# Patient Record
Sex: Male | Born: 1990 | Race: White | Hispanic: No | Marital: Married | Smoking: Never smoker
Health system: Southern US, Community
[De-identification: ages and names within clinical notes are randomized; demographics above are authoritative.]

---

## 2017-03-14 DIAGNOSIS — R300 Dysuria: Secondary | ICD-10-CM | POA: Insufficient documentation

## 2020-11-24 ENCOUNTER — Other Ambulatory Visit: Payer: Self-pay

## 2020-11-24 ENCOUNTER — Ambulatory Visit (INDEPENDENT_AMBULATORY_CARE_PROVIDER_SITE_OTHER): Payer: BC Managed Care – PPO | Admitting: Urology

## 2020-11-24 ENCOUNTER — Encounter: Payer: Self-pay | Admitting: Urology

## 2020-11-24 VITALS — BP 128/87 | HR 84 | Ht 68.0 in | Wt 158.0 lb

## 2020-11-24 DIAGNOSIS — F524 Premature ejaculation: Secondary | ICD-10-CM

## 2020-11-24 NOTE — Progress Notes (Signed)
   11/24/2020 11:40 AM   Matthew Meadows 1991-04-15 [id number]  Referring provider: Wilford Corner, PA-C [address] [place],  [place] [address]  Chief Complaint  Patient presents with  . Erectile Dysfunction    HPI: Matthew Meadows is a 30 y.o. male referred for erectile dysfunction.   Seen by PCP 11/08/2020 with a 2-year history of intermittent erectile dysfunction which has been more bothersome the last several months  Able to achieve an erection firm enough for penetration but complains of difficulty maintaining the erection  Estimates he will lose the erection 5 minutes after vaginal penetration and loses the erection after ejaculation  States he does feel his erections are less firm than previous  No organic risk factors including diabetes, neurologic disease, hypertension  Testosterone level normal at 603   PMH: History reviewed. No pertinent past medical history.  Surgical History: History reviewed. No pertinent surgical history.  Home Medications:  Allergies as of 11/24/2020   No Known Allergies     Medication List    as of November 24, 2020 11:40 AM   You have not been prescribed any medications.     Allergies: No Known Allergies  Family History: History reviewed. No pertinent family history.  Social History:  reports that he has never smoked. He has never used smokeless tobacco. He reports current alcohol use. No history on file for drug use.   Physical Exam: BP 128/87   Pulse 84   Ht 5\' 8"  (1.727 m)   Wt 158 lb (71.7 kg)   BMI 24.02 kg/m   Constitutional:  Alert and oriented, No acute distress. HEENT: [place] AT, moist mucus membranes.  Trachea midline, no masses. Cardiovascular: No clubbing, cyanosis, or edema. Respiratory: Normal respiratory effort, no increased work of breathing. GU: Phallus circumcised without lesions, testes descended bilaterally without masses or tenderness Skin: No rashes, bruises or suspicious  lesions. Neurologic: Grossly intact, no focal deficits, moving all 4 extremities. Psychiatric: Normal mood and affect.   Assessment & Plan:    1.  Premature ejaculation  We discussed that loss of erection after ejaculation is physiologic and this is not considered erectile dysfunction  He did say his initial erections are not as firm enough as previous however has no difficulty with penetration.  No organic risk factors for ED  We discussed various treatments for premature ejaculation including methods to decrease sensitivity (condom, topical anesthetics) and SSRI medications for their side effects of delayed ejaculation  I did offer him a trial of a PDE 5 inhibitor since he feels his erections are not as firm.  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

## 2020-12-22 ENCOUNTER — Other Ambulatory Visit: Payer: Self-pay | Admitting: Family Medicine

## 2020-12-22 ENCOUNTER — Other Ambulatory Visit (HOSPITAL_COMMUNITY): Payer: Self-pay | Admitting: Family Medicine

## 2020-12-22 DIAGNOSIS — N5089 Other specified disorders of the male genital organs: Secondary | ICD-10-CM

## 2020-12-28 ENCOUNTER — Other Ambulatory Visit: Payer: Self-pay

## 2020-12-28 ENCOUNTER — Ambulatory Visit (HOSPITAL_COMMUNITY)
Admission: RE | Admit: 2020-12-28 | Discharge: 2020-12-28 | Disposition: A | Discharge status: Home / Self Care | Payer: BC Managed Care – PPO | Source: Ambulatory Visit | Attending: Family Medicine | Admitting: Family Medicine

## 2020-12-28 ENCOUNTER — Other Ambulatory Visit (HOSPITAL_COMMUNITY): Payer: Self-pay | Admitting: Family Medicine

## 2020-12-28 DIAGNOSIS — N5089 Other specified disorders of the male genital organs: Secondary | ICD-10-CM | POA: Diagnosis not present

## 2021-02-04 ENCOUNTER — Ambulatory Visit (INDEPENDENT_AMBULATORY_CARE_PROVIDER_SITE_OTHER): Payer: BC Managed Care – PPO | Admitting: Urology

## 2021-02-04 ENCOUNTER — Other Ambulatory Visit: Payer: Self-pay

## 2021-02-04 ENCOUNTER — Encounter: Payer: Self-pay | Admitting: Urology

## 2021-02-04 VITALS — BP 125/80 | HR 0 | Ht 68.0 in | Wt 158.0 lb

## 2021-02-04 DIAGNOSIS — N5082 Scrotal pain: Secondary | ICD-10-CM

## 2021-02-04 DIAGNOSIS — N4340 Spermatocele of epididymis, unspecified: Secondary | ICD-10-CM | POA: Diagnosis not present

## 2021-02-04 MED ORDER — ETODOLAC 400 MG PO TABS: ORAL_TABLET | ORAL | 0 refills | Status: DC

## 2021-02-04 NOTE — Progress Notes (Signed)
   02/04/2021 10:11 AM   Matthew Meadows [birth year] [id number]  Referring provider: Wilford Corner, PA-C [address] [place],  [place] [address]  Chief Complaint  Patient presents with   Other    HPI: 30 y.o. male seen 11/24/2020 for erectile dysfunction presents for evaluation of an epididymal cyst.  Seen [facility] 12/22/2020 complaining of a right hemiscrotal mass with mild discomfort Scrotal sonogram performed 12/28/2020 with normal-appearing testes and a 4 mm cyst of the right globus major Complains of right testis pain which is worse with intercourse Tenderness is localized to this palpable area No bothersome LUTS   PMH: History reviewed. No pertinent past medical history.  Surgical History: History reviewed. No pertinent surgical history.  Home Medications:  Allergies as of 02/04/2021   No Known Allergies      Medication List    as of February 04, 2021 10:11 AM   You have not been prescribed any medications.     Allergies: No Known Allergies  Family History: History reviewed. No pertinent family history.  Social History:  reports that he has never smoked. He has never used smokeless tobacco. He reports current alcohol use. No history on file for drug use.   Physical Exam: BP 125/80   Pulse (!) 0   Ht 5\' 8"  (1.727 m)   Wt 158 lb (71.7 kg)   BMI 24.02 kg/m   Constitutional:  Alert and oriented, No acute distress. HEENT: [place] AT, moist mucus membranes.  Trachea midline, no masses. Cardiovascular: No clubbing, cyanosis, or edema. Respiratory: Normal respiratory effort, no increased work of breathing. GI: Abdomen is soft, nontender, nondistended, no abdominal masses GU: <5 mm smooth, spherical mass superior to right testis, minimal tenderness Skin: No rashes, bruises or suspicious lesions. Neurologic: Grossly intact, no focal deficits, moving all 4 extremities. Psychiatric: Normal mood and affect.   Pertinent Imaging: Ultrasound images  personally reviewed and interpreted   Assessment & Plan:    1.  Right hemiscrotal pain Small spermatocele right testis Would recommend conservative management initially with scrotal support and NSAIDs Rx etodolac sent to pharmacy We discussed typically it is uncommon for a spermatocele to be an etiology of scrotal pain unless it is of significant size We discussed the cyst originate from the reservoir which store sperm and that surgical excision could resulting scarring of the epididymal tubules We also discussed surgery for scrotal pain is typically felt to be ~ 50% effective If no improvement with NSAID consider a trial of low-dose amitriptyline 1 month follow-up symptom reassessment  2.  Right spermatocele As above   , MD  [facility] [address] [place], [place] [phone]

## 2021-02-17 ENCOUNTER — Encounter: Payer: Self-pay | Admitting: Emergency Medicine

## 2021-02-17 DIAGNOSIS — R1012 Left upper quadrant pain: Secondary | ICD-10-CM | POA: Diagnosis present

## 2021-02-17 DIAGNOSIS — K2900 Acute gastritis without bleeding: Secondary | ICD-10-CM | POA: Insufficient documentation

## 2021-02-17 LAB — URINALYSIS, COMPLETE (UACMP) WITH MICROSCOPIC
Bacteria, UA: NONE SEEN
Bilirubin Urine: NEGATIVE
Glucose, UA: NEGATIVE mg/dL
Hgb urine dipstick: NEGATIVE
Ketones, ur: NEGATIVE mg/dL
Leukocytes,Ua: NEGATIVE
Nitrite: NEGATIVE
Protein, ur: NEGATIVE mg/dL
Specific Gravity, Urine: 1.024 (ref 1.005–1.030)
Squamous Epithelial / HPF: NONE SEEN (ref 0–5)
pH: 6 (ref 5.0–8.0)

## 2021-02-17 LAB — CBC
HCT: 43.6 % (ref 39.0–52.0)
Hemoglobin: 15.3 g/dL (ref 13.0–17.0)
MCH: 29.4 pg (ref 26.0–34.0)
MCHC: 35.1 g/dL (ref 30.0–36.0)
MCV: 83.7 fL (ref 80.0–100.0)
Platelets: 250 10*3/uL (ref 150–400)
RBC: 5.21 MIL/uL (ref 4.22–5.81)
RDW: 11.9 % (ref 11.5–15.5)
WBC: 6.3 10*3/uL (ref 4.0–10.5)
nRBC: 0 % (ref 0.0–0.2)

## 2021-02-17 LAB — COMPREHENSIVE METABOLIC PANEL
ALT: 35 U/L (ref 0–44)
AST: 25 U/L (ref 15–41)
Albumin: 4.8 g/dL (ref 3.5–5.0)
Alkaline Phosphatase: 54 U/L (ref 38–126)
Anion gap: 6 (ref 5–15)
BUN: 19 mg/dL (ref 6–20)
CO2: 30 mmol/L (ref 22–32)
Calcium: 9.7 mg/dL (ref 8.9–10.3)
Chloride: 105 mmol/L (ref 98–111)
Creatinine, Ser: 1.24 mg/dL (ref 0.61–1.24)
GFR, Estimated: >60 mL/min (ref >60)
Glucose, Bld: 103 mg/dL — ABNORMAL HIGH (ref 70–99)
Potassium: 3.9 mmol/L (ref 3.5–5.1)
Sodium: 141 mmol/L (ref 135–145)
Total Bilirubin: 1.3 mg/dL — ABNORMAL HIGH (ref 0.3–1.2)
Total Protein: 7.9 g/dL (ref 6.5–8.1)

## 2021-02-17 LAB — TROPONIN I (HIGH SENSITIVITY): Troponin I (High Sensitivity): 6 ng/L (ref <18)

## 2021-02-17 LAB — LIPASE, BLOOD: Lipase: 36 U/L (ref 11–51)

## 2021-02-17 NOTE — ED Triage Notes (Signed)
Pt c/o LUQ abdominal pain that has been intermittent over 1 month. Pt recently diagnosed with hemiscrotal mass to the right testis with medication management. Pt denies emesis and diarrhea but has had nausea over the last month.

## 2021-02-18 ENCOUNTER — Emergency Department
Admission: EM | Admit: 2021-02-18 | Discharge: 2021-02-18 | Disposition: A | Discharge status: Home / Self Care | Payer: No Typology Code available for payment source | Attending: Emergency Medicine | Admitting: Emergency Medicine

## 2021-02-18 DIAGNOSIS — K2900 Acute gastritis without bleeding: Secondary | ICD-10-CM

## 2021-02-18 DIAGNOSIS — R1012 Left upper quadrant pain: Secondary | ICD-10-CM

## 2021-02-18 MED ORDER — LIDOCAINE VISCOUS HCL 2 % MT SOLN: 15.0000 mL | OROMUCOSAL | 0 refills | Status: DC | PRN

## 2021-02-18 NOTE — ED Provider Notes (Signed)
[facility] Emergency Department Provider Note   ____________________________________________   Event Date/Time   First MD Initiated Contact with Patient 02/18/21 0121     (approximate)  I have reviewed the triage vital signs and the nursing notes.   HISTORY  Chief Complaint Abdominal Pain    HPI Matthew Meadows is a 30 y.o. male with no significant past medical history who presents to the ED complaining of abdominal pain.  Patient reports that he has been dealing with persistent pain in the left upper quadrant of his abdomen for multiple months.  He describes it as a dull ache that seems to be worse when he has taken either ibuprofen or the ketorolac recently prescribed by urology.  Pain seems to improve when he eats and he denies any nausea, vomiting, changes in bowel movements, or blood in his stool.  This is occasionally associated with pain in his chest, which is also been going on for multiple months.  He denies any fevers, cough, or shortness of breath.  He has been previously evaluated by a cardiologist with unremarkable work-up.  He thought he could be developing a ulcer so he started taking omeprazole over the past 3 days, but denies any improvement in his pain.        History reviewed. No pertinent past medical history.  There are no problems to display for this patient.   History reviewed. No pertinent surgical history.  Prior to Admission medications   Medication Sig Start Date End Date Taking? Authorizing Provider  lidocaine (XYLOCAINE) 2 % solution Use as directed 15 mLs in the mouth or throat as needed for mouth pain. 02/18/21  Yes Chesley Noon, MD  etodolac (LODINE) 400 MG tablet 1 tab twice daily x2 weeks then 1 tab twice daily as needed for pain 02/04/21   Stoioff, Verna Czech, MD    Allergies Patient has no known allergies.  History reviewed. No pertinent family history.  Social History Social History   Tobacco Use   Smoking  status: Never   Smokeless tobacco: Never  Substance Use Topics   Alcohol use: Yes    Review of Systems  Constitutional: No fever/chills Eyes: No visual changes. ENT: No sore throat. Cardiovascular: Positive for chest pain. Respiratory: Denies shortness of breath. Gastrointestinal: Positive for abdominal pain.  No nausea, no vomiting.  No diarrhea.  No constipation. Genitourinary: Negative for dysuria. Musculoskeletal: Negative for back pain. Skin: Negative for rash. Neurological: Negative for headaches, focal weakness or numbness.  ____________________________________________   PHYSICAL EXAM:  VITAL SIGNS: ED Triage Vitals  Enc Vitals Group     BP 02/17/21 2158 133/87     Pulse Rate 02/17/21 2158 76     Resp 02/17/21 2158 17     Temp 02/17/21 2158 98.7 F (37.1 C)     Temp Source 02/17/21 2158 Oral     SpO2 02/17/21 2158 99 %     Weight 02/17/21 2155 157 lb 13.6 oz (71.6 kg)     Height 02/17/21 2155 5\' 8"  (1.727 m)     Head Circumference --      Peak Flow --      Pain Score --      Pain Loc --      Pain Edu? --      Excl. in GC? --     Constitutional: Alert and oriented. Eyes: Conjunctivae are normal. Head: Atraumatic. Nose: No congestion/rhinnorhea. Mouth/Throat: Mucous membranes are moist. Neck: Normal ROM Cardiovascular: Normal rate, regular rhythm.  Grossly normal heart sounds.  2+ radial pulses bilaterally. Respiratory: Normal respiratory effort.  No retractions. Lungs CTAB.  No chest wall tenderness to palpation. Gastrointestinal: Soft and nontender. No distention. Genitourinary: deferred Musculoskeletal: No lower extremity tenderness nor edema. Neurologic:  Normal speech and language. No gross focal neurologic deficits are appreciated. Skin:  Skin is warm, dry and intact. No rash noted. Psychiatric: Mood and affect are normal. Speech and behavior are normal.  ____________________________________________   LABS (all labs ordered are listed, but only  abnormal results are displayed)  Labs Reviewed  COMPREHENSIVE METABOLIC PANEL - Abnormal; Notable for the following components:      Result Value   Glucose, Bld 103 (*)    Total Bilirubin 1.3 (*)    All other components within normal limits  URINALYSIS, COMPLETE (UACMP) WITH MICROSCOPIC - Abnormal; Notable for the following components:   Color, Urine YELLOW (*)    APPearance CLEAR (*)    All other components within normal limits  LIPASE, BLOOD  CBC  TROPONIN I (HIGH SENSITIVITY)   ____________________________________________  EKG  ED ECG REPORT I, Chesley Noon, the attending physician, personally viewed and interpreted this ECG.   Date: 02/18/2021  EKG Time: 22:00  Rate: 70  Rhythm: normal sinus rhythm  Axis: RAD  Intervals:none  ST&T Change: None   PROCEDURES  Procedure(s) performed (including Critical Care):  Procedures   ____________________________________________   INITIAL IMPRESSION / ASSESSMENT AND PLAN / ED COURSE      30 year old male with no significant past medical history presents to the ED with left lower quadrant abdominal pain for multiple months occasionally associated with pain in his chest.  EKG shows no evidence of arrhythmia or ischemia and troponin is negative.  I doubt ACS or PE given his atypical symptoms, chest pain seems more likely related to gastritis/reflux.  Patient has no tenderness in the left upper quadrant and labs are unremarkable, LFTs and lipase within normal limits.  Patient is appropriate for discharge home with GI follow-up, he was counseled to stop NSAIDs and continue on omeprazole.  We will prescribe viscous lidocaine for use as needed and he was counseled to return to the ED for new or worsening symptoms, patient agrees with plan.      ____________________________________________   FINAL CLINICAL IMPRESSION(S) / ED DIAGNOSES  Final diagnoses:  Left upper quadrant abdominal pain  Acute gastritis without hemorrhage,  unspecified gastritis type     ED Discharge Orders          Ordered    lidocaine (XYLOCAINE) 2 % solution  As needed        02/18/21 0209             Note:  This document was prepared using Dragon voice recognition software and may include unintentional dictation errors.    Chesley Noon, MD 02/18/21 [phone]

## 2021-03-11 ENCOUNTER — Other Ambulatory Visit: Payer: Self-pay

## 2021-03-11 ENCOUNTER — Encounter: Payer: Self-pay | Admitting: Physician Assistant

## 2021-03-11 ENCOUNTER — Ambulatory Visit (INDEPENDENT_AMBULATORY_CARE_PROVIDER_SITE_OTHER): Payer: BC Managed Care – PPO | Admitting: Physician Assistant

## 2021-03-11 VITALS — BP 120/76 | HR 66 | Ht 68.0 in | Wt 161.0 lb

## 2021-03-11 DIAGNOSIS — N50811 Right testicular pain: Secondary | ICD-10-CM | POA: Diagnosis not present

## 2021-03-11 MED ORDER — AMITRIPTYLINE HCL 25 MG PO TABS: ORAL_TABLET | ORAL | 0 refills | Status: DC

## 2021-03-11 NOTE — Progress Notes (Signed)
   03/11/2021 9:08 AM   Matthew Meadows 1990-11-15 [id number]  CC: Chief Complaint  Patient presents with   Follow-up   HPI: Matthew Meadows is a 30 y.o. male with PMH premature ejaculation, right testicular pain, and right spermatocele who presents today for symptom recheck on etodolac.    He was started on NSAIDs with Dr. Lonna Cobb on 02/04/2021 for management of localized right testis pain worse with intercourse possibly associated with underlying spermatocele.  He was seen in the emergency department on 02/18/2021 with reports of LUQ abdominal pain worse after starting etodolac.  He was counseled to stop NSAIDs and continue omeprazole with plans for GI follow-up.  Today he reports his abdominal pain is somewhat improved, but he has had persistent right testicular pain especially after intercourse and with lying supine.   He wears boxer briefs and wonders if dietary changes may improve his discomfort.  PMH: No past medical history on file.  Surgical History: No past surgical history on file.  Home Medications:  Allergies as of 03/11/2021   No Known Allergies      Medication List        Accurate as of March 11, 2021  9:08 AM. If you have any questions, ask your nurse or doctor.          etodolac 400 MG tablet Commonly known as: LODINE 1 tab twice daily x2 weeks then 1 tab twice daily as needed for pain   lidocaine 2 % solution Commonly known as: XYLOCAINE Use as directed 15 mLs in the mouth or throat as needed for mouth pain.   omeprazole 20 MG capsule Commonly known as: PRILOSEC Take 20 mg by mouth daily.        Allergies:  No Known Allergies  Family History: No family history on file.  Social History:   reports that he has never smoked. He has never used smokeless tobacco. He reports current alcohol use. No history on file for drug use.  Physical Exam: BP 120/76   Pulse 66   Ht 5\' 8"  (1.727 m)   Wt 161 lb (73 kg)   BMI 24.48 kg/m   Constitutional:   Alert and oriented, no acute distress, nontoxic appearing HEENT: [place], AT Cardiovascular: No clubbing, cyanosis, or edema Respiratory: Normal respiratory effort, no increased work of breathing Skin: No rashes, bruises or suspicious lesions Neurologic: Grossly intact, no focal deficits, moving all 4 extremities Psychiatric: Normal mood and affect  Assessment & Plan:   1. Right testicular pain Gastritis on NSAIDs and persistent pain noted today.  Offered him a trial of amitriptyline, which he accepted.  We discussed that dietary changes typically do not improve testicular pain, but I encouraged him to continue wearing supportive underwear and take Tylenol as needed for pain control.  He expressed understanding. - amitriptyline (ELAVIL) 25 MG tablet; Take 0.5 tablets (12.5 mg total) by mouth at bedtime for 8 days, THEN 1 tablet (25 mg total) at bedtime for 22 days.  Dispense: 26 tablet; Refill: 0   Return in about 4 weeks (around 04/08/2021) for Sx recheck with Dr. 06/08/2021.  Lonna Cobb, PA-C  [facility] [address], [address] [place], [place] [phone]

## 2021-04-13 ENCOUNTER — Encounter: Payer: Self-pay | Admitting: Gastroenterology

## 2021-04-13 ENCOUNTER — Other Ambulatory Visit: Payer: Self-pay

## 2021-04-13 ENCOUNTER — Ambulatory Visit (INDEPENDENT_AMBULATORY_CARE_PROVIDER_SITE_OTHER): Payer: BC Managed Care – PPO | Admitting: Gastroenterology

## 2021-04-13 VITALS — BP 117/72 | HR 99 | Temp 98.2°F | Ht 68.0 in | Wt 159.4 lb

## 2021-04-13 DIAGNOSIS — H5210 Myopia, unspecified eye: Secondary | ICD-10-CM | POA: Insufficient documentation

## 2021-04-13 DIAGNOSIS — R1012 Left upper quadrant pain: Secondary | ICD-10-CM | POA: Diagnosis not present

## 2021-04-13 MED ORDER — PANTOPRAZOLE SODIUM 40 MG PO TBEC: 40.0000 mg | DELAYED_RELEASE_TABLET | Freq: Every day | ORAL | 3 refills | Status: AC

## 2021-04-13 NOTE — Progress Notes (Signed)
Gastroenterology Consultation  Referring Provider:     Wilford Corner,* Primary Care Physician:  Wilford Corner, PA-C Primary Gastroenterologist:  Dr. Servando Snare     Reason for Consultation:     Left side abdominal pain        HPI:   Trevor Wilkie is a 30 y.o. y/o male referred for consultation & management of left side abdominal pain by Dr. Harlon Flor, Jonnie Finner, PA-C.  This patient comes to see me after being seen in the ER for abdominal pain.  The patient reports that the abdominal pain is in the left side of his abdomen and had been present for a few months prior to going to the emergency room.  The patient also reports that it is made better by eating and made worse by anti-inflammatory medication that he was given by his urologist.  The patient denied any nausea vomiting fevers chills black stools or bloody stools. The patient works as a [employer].  He reports the pain to be at the junction of the ribs and the abdominal wall muscles.  He does report that he had this pain couple years ago and it went away on its own.  He also reports that sometimes he has some back issues with him stating that he sometimes stands crooked.  There is no report of any change in bowel habits associated with this discomfort.  History reviewed. No pertinent past medical history.  History reviewed. No pertinent surgical history.  Prior to Admission medications   Medication Sig Start Date End Date Taking? Authorizing Provider  amitriptyline (ELAVIL) 25 MG tablet Take 0.5 tablets (12.5 mg total) by mouth at bedtime for 8 days, THEN 1 tablet (25 mg total) at bedtime for 22 days. 03/11/21 04/10/21  Vaillancourt, Lelon Mast, PA-C  lidocaine (XYLOCAINE) 2 % solution Use as directed 15 mLs in the mouth or throat as needed for mouth pain. Patient not taking: Reported on 03/11/2021 02/18/21   Chesley Noon, MD  omeprazole (PRILOSEC) 20 MG capsule Take 20 mg by mouth daily.    [provider]     History reviewed. No pertinent family history.   Social History   Tobacco Use   Smoking status: Never   Smokeless tobacco: Never  Substance Use Topics   Alcohol use: Yes    Allergies as of 04/13/2021   (No Known Allergies)    Review of Systems:    All systems reviewed and negative except where noted in HPI.   Physical Exam:  BP 117/72 (BP Location: Left Arm, Patient Position: Sitting, Cuff Size: Large)   Pulse 99   Temp 98.2 F (36.8 C) (Temporal)   Ht 5\' 8"  (1.727 m)   Wt 159 lb 6.4 oz (72.3 kg)   BMI 24.24 kg/m  No LMP for male patient. General:   Alert,  Well-developed, well-nourished, pleasant and cooperative in NAD Head:  Normocephalic and atraumatic. Eyes:  Sclera clear, no icterus.   Conjunctiva pink. Ears:  Normal auditory acuity. Neck:  Supple; no masses or thyromegaly. Lungs:  Respirations even and unlabored.  Clear throughout to auscultation.   No wheezes, crackles, or rhonchi. No acute distress. Heart:  Regular rate and rhythm; no murmurs, clicks, rubs, or gallops. Abdomen:  Normal bowel sounds.  No bruits.  Soft, non-tender and positive tenderness to 1 finger palpation while flexing the abdominal wall muscles without masses, hepatosplenomegaly or hernias noted.  No guarding or rebound tenderness.  positive Carnett sign.   Rectal:  Deferred.  Pulses:  Normal pulses noted. Extremities:  No clubbing or edema.  No cyanosis. Neurologic:  Alert and oriented x3;  grossly normal neurologically. Skin:  Intact without significant lesions or rashes.  No jaundice. Lymph Nodes:  No significant cervical adenopathy. Psych:  Alert and cooperative. Normal mood and affect.  Imaging Studies: No results found.  Assessment and Plan:   Kamel Haven is a 30 y.o. y/o male who comes in today with a history of abdominal pain at the border of the ribs insertion to the rectus abdominis muscles.  The cover was exacerbated while flexing the abdominal wall muscles.  The patient  has been told that he likely has muscular skeletal pain but may also have increased acid production due to the stress of the pain and will be started on Protonix 40 mg once a day to help with the dyspepsia and has been told to avoid things that are straining the abdominal wall muscles.  The abdominal wall pain is more likely since this patient has a positive Carnett's sign and because he has a history of back discomfort in the past also.  The patient has been explained the plan and agrees with it.  The patient has also been told to contact me if his symptoms get worse or change.    Midge Minium, MD. Clementeen Graham    Note: This dictation was prepared with Dragon dictation along with smaller phrase technology. Any transcriptional errors that result from this process are unintentional.

## 2021-04-14 ENCOUNTER — Ambulatory Visit (INDEPENDENT_AMBULATORY_CARE_PROVIDER_SITE_OTHER): Payer: BC Managed Care – PPO | Admitting: Urology

## 2021-04-14 ENCOUNTER — Encounter: Payer: Self-pay | Admitting: Urology

## 2021-04-14 ENCOUNTER — Other Ambulatory Visit: Payer: Self-pay

## 2021-04-14 VITALS — BP 117/76 | HR 80 | Ht 68.0 in | Wt 159.0 lb

## 2021-04-14 DIAGNOSIS — N5082 Scrotal pain: Secondary | ICD-10-CM | POA: Diagnosis not present

## 2021-04-14 NOTE — Progress Notes (Signed)
   04/14/2021 10:18 AM   Matthew Meadows 30-Oct-1990 [id number]  Referring provider: Wilford Corner, PA-C [address] [place],  [place] [address]  Chief Complaint  Patient presents with   Follow-up    HPI: 30 y.o. male presents for follow-up visit.  Seen June 2022 with right hemiscrotal pain worse with intercourse Scrotal sonogram with normal-appearing testes and a 4 mm right spermatocele Scrotal support and NSAIDs initially recommended ED visit 02/18/2021 for abdominal pain felt to be secondary to NSAID use and this medication was discontinued Saw Hilton Sinclair 03/11/2021 without significant improvement in pain and given a trial of amitriptyline He states no significant improvement with amitriptyline  PMH: History reviewed. No pertinent past medical history.  Surgical History: History reviewed. No pertinent surgical history.  Home Medications:  Allergies as of 04/14/2021   No Known Allergies      Medication List        Accurate as of April 14, 2021 10:18 AM. If you have any questions, ask your nurse or doctor.          amitriptyline 25 MG tablet Commonly known as: ELAVIL Take 0.5 tablets (12.5 mg total) by mouth at bedtime for 8 days, THEN 1 tablet (25 mg total) at bedtime for 22 days. Start taking on: March 11, 2021   pantoprazole 40 MG tablet Commonly known as: PROTONIX Take 1 tablet (40 mg total) by mouth daily.        Allergies: No Known Allergies  Family History: History reviewed. No pertinent family history.  Social History:  reports that he has never smoked. He has never used smokeless tobacco. He reports current alcohol use. No history on file for drug use.   Physical Exam: BP 117/76   Pulse 80   Ht 5\' 8"  (1.727 m)   Wt 159 lb (72.1 kg)   BMI 24.18 kg/m   Constitutional:  Alert and oriented, No acute distress. HEENT: [place] AT, moist mucus membranes.  Trachea midline, no masses. Cardiovascular: No clubbing, cyanosis, or  edema. Respiratory: Normal respiratory effort, no increased work of breathing. 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

## 2021-07-28 ENCOUNTER — Encounter: Payer: Self-pay | Admitting: Urology

## 2021-07-28 ENCOUNTER — Other Ambulatory Visit: Payer: Self-pay

## 2021-07-28 ENCOUNTER — Ambulatory Visit (INDEPENDENT_AMBULATORY_CARE_PROVIDER_SITE_OTHER): Payer: BC Managed Care – PPO | Admitting: Urology

## 2021-07-28 VITALS — BP 134/87 | HR 73 | Ht 68.0 in | Wt 158.0 lb

## 2021-07-28 DIAGNOSIS — R1031 Right lower quadrant pain: Secondary | ICD-10-CM

## 2021-07-30 NOTE — Progress Notes (Signed)
   07/28/2021 10:53 AM   Matthew Meadows [birth year] [id number]  Referring provider: Wilford Corner, PA-C [address] [place],  [place] [address]  Chief Complaint  Patient presents with   Groin Pain    HPI: 30 y.o. male presents with complaints of right groin pain.  Previously seen for right hemiscrotal pain 4-5 months ago and had a negative scrotal ultrasound Negative scrotal sonogram No improvement with amitriptyline At last visit August 2022 he elected no further treatment Presents today with 1-31-month history of pain in the right inguinal area right.  Some radiation to the hip region No identifiable precipitating, aggravating or alleviating factors Present symptoms different than his previous scrotal pain   PMH: History reviewed. No pertinent past medical history.  Surgical History: History reviewed. No pertinent surgical history.  Home Medications:  Allergies as of 07/28/2021   No Known Allergies      Medication List        Accurate as of July 28, 2021 11:59 PM. If you have any questions, ask your nurse or doctor.          STOP taking these medications    amitriptyline 25 MG tablet Commonly known as: ELAVIL Stopped by: Riki Altes, MD       TAKE these medications    pantoprazole 40 MG tablet Commonly known as: PROTONIX Take 1 tablet (40 mg total) by mouth daily.        Allergies: No Known Allergies  Family History: History reviewed. No pertinent family history.  Social History:  reports that he has never smoked. He has never used smokeless tobacco. He reports current alcohol use. No history on file for drug use.   Physical Exam: BP 134/87   Pulse 73   Ht 5\' 8"  (1.727 m)   Wt 158 lb (71.7 kg)   BMI 24.02 kg/m   Constitutional:  Alert and oriented, No acute distress. HEENT: [place] AT, moist mucus membranes.  Trachea midline, no masses. Cardiovascular: No clubbing, cyanosis, or edema. Respiratory: Normal respiratory  effort, no increased work of breathing. GI: Abdomen is soft, nontender, nondistended, no abdominal masses GU: Phallus without lesions, testes descended bilaterally without masses or tenderness.  No inguinal hernia appreciated Psychiatric: Normal mood and affect.    Assessment & Plan:    1.  Right groin pain No abnormalities on exam Schedule CT pelvis for further evaluation   , MD  [facility] [address] [place], [place] [phone]

## 2021-08-05 ENCOUNTER — Other Ambulatory Visit: Payer: Self-pay

## 2021-08-05 ENCOUNTER — Encounter: Payer: Self-pay | Admitting: Emergency Medicine

## 2021-08-05 DIAGNOSIS — W268XXA Contact with other sharp object(s), not elsewhere classified, initial encounter: Secondary | ICD-10-CM | POA: Diagnosis not present

## 2021-08-05 DIAGNOSIS — S6991XA Unspecified injury of right wrist, hand and finger(s), initial encounter: Secondary | ICD-10-CM | POA: Diagnosis present

## 2021-08-05 DIAGNOSIS — Z23 Encounter for immunization: Secondary | ICD-10-CM | POA: Insufficient documentation

## 2021-08-05 DIAGNOSIS — S61212A Laceration without foreign body of right middle finger without damage to nail, initial encounter: Secondary | ICD-10-CM | POA: Diagnosis not present

## 2021-08-05 NOTE — ED Triage Notes (Addendum)
Pt arrived via POV with reports of L middle finger laceration from utility blade about 2 hours PTA, site continues to bleed, pt holding pressure at this time.  States tetanus shot due in 2023

## 2021-08-06 ENCOUNTER — Emergency Department
Admission: EM | Admit: 2021-08-06 | Discharge: 2021-08-06 | Disposition: A | Discharge status: Home / Self Care | Payer: No Typology Code available for payment source | Attending: Emergency Medicine | Admitting: Emergency Medicine

## 2021-08-06 DIAGNOSIS — S61212A Laceration without foreign body of right middle finger without damage to nail, initial encounter: Secondary | ICD-10-CM

## 2021-08-06 MED ORDER — LIDOCAINE HCL (PF) 1 % IJ SOLN
5.0000 mL | Freq: Once | INTRAMUSCULAR | Status: AC
  Administered 2021-08-06: 5 mL via INTRADERMAL
  Filled 2021-08-06: qty 5

## 2021-08-06 MED ORDER — BACITRACIN ZINC 500 UNIT/GM EX OINT
TOPICAL_OINTMENT | Freq: Once | CUTANEOUS | Status: AC
  Filled 2021-08-06: qty 0.9

## 2021-08-06 MED ORDER — TETANUS-DIPHTH-ACELL PERTUSSIS 5-2.5-18.5 LF-MCG/0.5 IM SUSY
0.5000 mL | PREFILLED_SYRINGE | Freq: Once | INTRAMUSCULAR | Status: AC
  Administered 2021-08-06: 0.5 mL via INTRAMUSCULAR
  Filled 2021-08-06: qty 0.5

## 2021-08-06 NOTE — ED Provider Notes (Signed)
[facility] Emergency Department Provider Note  ____________________________________________   Event Date/Time   First MD Initiated Contact with Patient 08/06/21 0234     (approximate)  I have reviewed the triage vital signs and the nursing notes.   HISTORY  Chief Complaint Laceration (L Middle Finger)    HPI Matthew Meadows is a 30 y.o. male with no significant past medical history who presents to the emergency department with a laceration to the tip of the left third digit.  States this occurred last night when using a box cutter.  It was not intentional.  Last tetanus vaccination was 9 years ago.  No other injury.  Patient is right-hand dominant.        History reviewed. No pertinent past medical history.  Patient Active Problem List   Diagnosis Date Noted   Myopia 04/13/2021   Dysuria 03/14/2017    History reviewed. No pertinent surgical history.  Prior to Admission medications   Medication Sig Start Date End Date Taking? Authorizing Provider  pantoprazole (PROTONIX) 40 MG tablet Take 1 tablet (40 mg total) by mouth daily. 04/13/21   Midge Minium, MD    Allergies Patient has no known allergies.  History reviewed. No pertinent family history.  Social History Social History   Tobacco Use   Smoking status: Never   Smokeless tobacco: Never  Substance Use Topics   Alcohol use: Yes    Review of Systems Constitutional: No fever. Eyes: No visual changes. ENT: No sore throat. Cardiovascular: Denies chest pain. Respiratory: Denies shortness of breath. Gastrointestinal: No nausea, vomiting, diarrhea. Genitourinary: Negative for dysuria. Musculoskeletal: Negative for back pain. Skin: Negative for rash. Neurological: Negative for focal weakness or numbness.  ____________________________________________   PHYSICAL EXAM:  VITAL SIGNS: ED Triage Vitals  Enc Vitals Group     BP 08/05/21 2242 129/84     Pulse Rate 08/05/21 2242 86      Resp 08/05/21 2242 17     Temp 08/05/21 2242 98.1 F (36.7 C)     Temp Source 08/05/21 2242 Oral     SpO2 08/05/21 2242 100 %     Weight 08/05/21 2242 160 lb (72.6 kg)     Height 08/05/21 2242 5\' 8"  (1.727 m)     Head Circumference --      Peak Flow --      Pain Score 08/05/21 2246 4     Pain Loc --      Pain Edu? --      Excl. in GC? --    CONSTITUTIONAL: Alert and responds appropriately to questions. Well-appearing; well-nourished HEAD: Normocephalic, atraumatic EYES: Conjunctivae clear, pupils appear equal ENT: normal nose; moist mucous membranes NECK: Normal range of motion CARD: Regular rate and rhythm RESP: Normal chest excursion without splinting or tachypnea; no hypoxia or respiratory distress, speaking full sentences ABD/GI: non-distended EXT: Normal ROM in all joints, no major deformities noted, 2+ left radial pulse, normal capillary refill, normal sensation SKIN: Normal color for age and race, no rashes on exposed skin, 2 cm laceration to the tip of the left third finger that is superficial and does not appear to have any tendon involvement or retained foreign body NEURO: Moves all extremities equally, normal speech, no facial asymmetry noted PSYCH: The patient's mood and manner are appropriate. Grooming and personal hygiene are appropriate.  ____________________________________________   LABS (all labs ordered are listed, but only abnormal results are displayed)  Labs Reviewed - No data to display ____________________________________________  EKG  ____________________________________________  RADIOLOGY Normajean Baxter Nayib Remer, personally viewed and evaluated these images (plain radiographs) as part of my medical decision making, as well as reviewing the written report by the radiologist.  ED MD interpretation:    Official radiology report(s): No results found.  ____________________________________________   PROCEDURES  Procedure(s) performed (including  Critical Care):  Procedures  LACERATION REPAIR Performed by: Rochele Raring Authorized by: Rochele Raring Consent: Verbal consent obtained. Risks and benefits: risks, benefits and alternatives were discussed Consent given by: patient Patient identity confirmed: provided demographic data Prepped and Draped in normal sterile fashion Wound explored  Laceration Location: Left middle finger  Laceration Length: 2cm  No Foreign Bodies seen or palpated  Anesthesia: local infiltration  Local anesthetic: lidocaine 1% without epinephrine  Anesthetic total: 3 ml  Irrigation method: syringe Amount of cleaning: standard  Skin closure: Superficial  Number of sutures: 4  Technique: Area anesthetized using lidocaine 1% without epinephrine. Wound irrigated copiously with sterile saline. Wound then cleaned with Betadine and draped in sterile fashion. Wound closed using 4 simple interrupted sutures with 5-0 Monocryl.  Bacitracin and sterile dressing applied. Good wound approximation and hemostasis achieved.    Patient tolerance: Patient tolerated the procedure well with no immediate complications.    ____________________________________________   INITIAL IMPRESSION / ASSESSMENT AND PLAN / ED COURSE  As part of my medical decision making, I reviewed the following data within the electronic MEDICAL RECORD NUMBER History obtained from family, Old chart reviewed, and Notes from prior ED visits         Patient here after an accidental injury to his left middle finger.  Has a 2 cm laceration that will need repair.  No other injury on exam.  Neurovascular intact distally.  Will update tetanus vaccination and close wound.  ED PROGRESS  Patient's wound repaired without incident.  Recommended wound care and alternating Tylenol Motrin over-the-counter for pain.  He works as a Curator.  We will place him in a finger splint and give him a couple of days off work so that he can keep this clean and dry  until it starts to heal.  Discussed return precautions.  Patient comfortable with plan.   At this time, I do not feel there is any life-threatening condition present. I have reviewed, interpreted and discussed all results (EKG, imaging, lab, urine as appropriate) and exam findings with patient/family. I have reviewed nursing notes and appropriate previous records.  I feel the patient is safe to be discharged home without further emergent workup and can continue workup as an outpatient as needed. Discussed usual and customary return precautions. Patient/family verbalize understanding and are comfortable with this plan.  Outpatient follow-up has been provided as needed. All questions have been answered.   ____________________________________________   FINAL CLINICAL IMPRESSION(S) / ED DIAGNOSES  Final diagnoses:  Laceration of right middle finger without foreign body without damage to nail, initial encounter     ED Discharge Orders     None       *Please note:  Matthew Meadows was evaluated in Emergency Department on 08/06/2021 for the symptoms described in the history of present illness. He was evaluated in the context of the global COVID-19 pandemic, which necessitated consideration that the patient might be at risk for infection with the SARS-CoV-2 virus that causes COVID-19. Institutional protocols and algorithms that pertain to the evaluation of patients at risk for COVID-19 are in a state of rapid change based on information released by regulatory bodies including the CDC  and federal and state organizations. These policies and algorithms were followed during the patient's care in the ED.  Some ED evaluations and interventions may be delayed as a result of limited staffing during and the pandemic.*   Note:  This document was prepared using Dragon voice recognition software and may include unintentional dictation errors.    Isabellah Sobocinski, Layla Maw, DO 08/06/21 [phone]

## 2021-08-06 NOTE — Discharge Instructions (Addendum)
You may alternate Tylenol 1000 mg every 6 hours as needed for pain, fever and Ibuprofen 800 mg every 8 hours as needed for pain, fever.  Please take Ibuprofen with food.  Do not take more than 4000 mg of Tylenol (acetaminophen) in a 24 hour period.   Your sutures are absorbable and do not need to be removed.  They will follow out on their own in the next 1 to 2 weeks.   You may clean your wound gently once a day with warm soap and water and apply over-the-counter Neosporin and then a dressing.  We have placed you in a finger splint just to protect this area that you can wear as needed for the next several days.  Please make sure you keep your hand clean and dry.

## 2021-08-18 ENCOUNTER — Ambulatory Visit
Admission: RE | Admit: 2021-08-18 | Discharge: 2021-08-18 | Disposition: A | Discharge status: Home / Self Care | Payer: BC Managed Care – PPO | Source: Ambulatory Visit | Attending: Urology | Admitting: Urology

## 2021-08-18 ENCOUNTER — Other Ambulatory Visit: Payer: Self-pay

## 2021-08-18 DIAGNOSIS — R1031 Right lower quadrant pain: Secondary | ICD-10-CM | POA: Diagnosis not present

## 2021-08-23 ENCOUNTER — Encounter: Payer: Self-pay | Admitting: *Deleted

## 2022-02-07 ENCOUNTER — Emergency Department (HOSPITAL_BASED_OUTPATIENT_CLINIC_OR_DEPARTMENT_OTHER)
Admission: EM | Admit: 2022-02-07 | Discharge: 2022-02-08 | Disposition: A | Discharge status: Home / Self Care | Payer: No Typology Code available for payment source | Attending: Emergency Medicine | Admitting: Emergency Medicine

## 2022-02-07 ENCOUNTER — Encounter (HOSPITAL_BASED_OUTPATIENT_CLINIC_OR_DEPARTMENT_OTHER): Payer: Self-pay | Admitting: Emergency Medicine

## 2022-02-07 ENCOUNTER — Other Ambulatory Visit: Payer: Self-pay

## 2022-02-07 DIAGNOSIS — R519 Headache, unspecified: Secondary | ICD-10-CM | POA: Diagnosis not present

## 2022-02-07 DIAGNOSIS — R42 Dizziness and giddiness: Secondary | ICD-10-CM | POA: Insufficient documentation

## 2022-02-07 DIAGNOSIS — R079 Chest pain, unspecified: Secondary | ICD-10-CM | POA: Diagnosis present

## 2022-02-07 DIAGNOSIS — R0789 Other chest pain: Secondary | ICD-10-CM

## 2022-02-07 NOTE — ED Triage Notes (Signed)
Pt has sudden tightness in lower chest about 11pm the vision went black and facial numbness.

## 2022-02-08 ENCOUNTER — Emergency Department (HOSPITAL_BASED_OUTPATIENT_CLINIC_OR_DEPARTMENT_OTHER): Payer: No Typology Code available for payment source

## 2022-02-08 LAB — URINALYSIS, ROUTINE W REFLEX MICROSCOPIC
Bilirubin Urine: NEGATIVE
Glucose, UA: NEGATIVE mg/dL
Hgb urine dipstick: NEGATIVE
Ketones, ur: NEGATIVE mg/dL
Leukocytes,Ua: NEGATIVE
Nitrite: NEGATIVE
Protein, ur: NEGATIVE mg/dL
Specific Gravity, Urine: 1.02 (ref 1.005–1.030)
pH: 5.5 (ref 5.0–8.0)

## 2022-02-08 LAB — CBC
HCT: 40.1 % (ref 39.0–52.0)
Hemoglobin: 14.2 g/dL (ref 13.0–17.0)
MCH: 29.9 pg (ref 26.0–34.0)
MCHC: 35.4 g/dL (ref 30.0–36.0)
MCV: 84.4 fL (ref 80.0–100.0)
Platelets: 219 10*3/uL (ref 150–400)
RBC: 4.75 MIL/uL (ref 4.22–5.81)
RDW: 12.1 % (ref 11.5–15.5)
WBC: 5.2 10*3/uL (ref 4.0–10.5)
nRBC: 0 % (ref 0.0–0.2)

## 2022-02-08 LAB — BASIC METABOLIC PANEL
Anion gap: 7 (ref 5–15)
BUN: 14 mg/dL (ref 6–20)
CO2: 26 mmol/L (ref 22–32)
Calcium: 8.9 mg/dL (ref 8.9–10.3)
Chloride: 105 mmol/L (ref 98–111)
Creatinine, Ser: 1.23 mg/dL (ref 0.61–1.24)
GFR, Estimated: >60 mL/min (ref >60)
Glucose, Bld: 96 mg/dL (ref 70–99)
Potassium: 3.5 mmol/L (ref 3.5–5.1)
Sodium: 138 mmol/L (ref 135–145)

## 2022-02-08 LAB — TROPONIN I (HIGH SENSITIVITY)
Troponin I (High Sensitivity): 4 ng/L (ref <18)
Troponin I (High Sensitivity): 4 ng/L (ref <18)

## 2022-02-08 LAB — CBG MONITORING, ED: Glucose-Capillary: 107 mg/dL — ABNORMAL HIGH (ref 70–99)

## 2022-02-08 NOTE — ED Provider Notes (Signed)
[facility] EMERGENCY DEPARTMENT  Provider Note  CSN: [id number] Arrival date & time: 02/07/22 2347  History Chief Complaint  Patient presents with   Chest Pain    Matthew Meadows is a 31 y.o. male reports he has had dizziness and headache off and on for several days. He went to the [place] clinic on Monday and given Rx for meclizine. Tonight as he was finishing up his shift at work, he noticed a numbness and cramping pain in his lower chest/upper abdomen. He began to feel SOB and like his heart was going to stop. He then began to feel tingling in his face and his vision went black for about 5 seconds. Symptoms resolved quickly. No prior history of HTN, DM or CAD. He does have some anxiety but has not had similar episodes before. He had one or two more episodes, less severe since being in the ED triggered by the monitor alarming (for high and low RR). He has been NSR on cardiac monitor throughout.    Home Medications Prior to Admission medications   Medication Sig Start Date End Date Taking? Authorizing Provider  pantoprazole (PROTONIX) 40 MG tablet Take 1 tablet (40 mg total) by mouth daily. 04/13/21   Lucilla Lame, MD     Allergies    Patient has no known allergies.   Review of Systems   Review of Systems Please see HPI for pertinent positives and negatives  Physical Exam BP 122/82   Pulse 80   Temp 98 F (36.7 C) (Oral)   Resp 13   Ht 5\' 5"  (1.651 m)   Wt 72.6 kg   SpO2 99%   BMI 26.63 kg/m   Physical Exam Vitals and nursing note reviewed.  Constitutional:      Appearance: Normal appearance.  HENT:     Head: Normocephalic and atraumatic.     Nose: Nose normal.     Mouth/Throat:     Mouth: Mucous membranes are moist.  Eyes:     Extraocular Movements: Extraocular movements intact.     Conjunctiva/sclera: Conjunctivae normal.  Cardiovascular:     Rate and Rhythm: Normal rate.  Pulmonary:     Effort: Pulmonary effort is normal.     Breath sounds: Normal  breath sounds.  Abdominal:     General: Abdomen is flat.     Palpations: Abdomen is soft.     Tenderness: There is no abdominal tenderness.  Musculoskeletal:        General: No swelling. Normal range of motion.     Cervical back: Neck supple.  Skin:    General: Skin is warm and dry.  Neurological:     General: No focal deficit present.     Mental Status: He is alert.  Psychiatric:        Mood and Affect: Mood normal.     ED Results / Procedures / Treatments   EKG EKG Interpretation  Date/Time:  Tuesday February 07 2022 23:54:34 EDT Ventricular Rate:  80 PR Interval:  138 QRS Duration: 101 QT Interval:  368 QTC Calculation: 425 R Axis:   91 Text Interpretation: Sinus rhythm Borderline right axis deviation No significant change since last tracing Confirmed by Calvert Cantor [phone]) on 02/08/2022 12:47:08 AM  Procedures Procedures  Medications Ordered in the ED Medications - No data to display  Initial Impression and Plan  Patient with nonspecific dizzziness/headache for several days now with an episode of chest pain and transient vision loss. Will check cardiac labs, CXR, CT  head to rule out acute cardiopulmonary or neuro process. His PERC is neg. No concern for PE. Also consider panic/anxiety given his symptoms of hyperventilation as well.   ED Course   Clinical Course as of 02/08/22 0415  Wed Feb 08, 2022  0112 CBC, BMP and UA are normal.  [CS]  0130 First trop is normal.  [CS]  0130 I personally viewed the images from radiology studies and agree with radiologist interpretation: CXR and CT both normal.   [CS]  0403 Repeat Trop remains normal.  [CS]  0413 Patient remains asymptomatic. No apparent life threatening cause of his symptoms identified in the ED. Recommend he follow up with PCP if his symptoms return or RTED if he has any other concerns.  [CS]    Clinical Course User Index [CS] Truddie Hidden, MD     MDM Rules/Calculators/A&P Medical Decision  Making Given presenting complaint, I considered that admission might be necessary. After review of results from ED lab and/or imaging studies, admission to the hospital is not indicated at this time.    Problems Addressed: Atypical chest pain: acute illness or injury  Amount and/or Complexity of Data Reviewed Labs: ordered. Decision-making details documented in ED Course. Radiology: ordered and independent interpretation performed. Decision-making details documented in ED Course. ECG/medicine tests: ordered and independent interpretation performed. Decision-making details documented in ED Course.  Risk Decision regarding hospitalization.    Final Clinical Impression(s) / ED Diagnoses Final diagnoses:  Atypical chest pain    Rx / DC Orders ED Discharge Orders     None        Truddie Hidden, MD 02/08/22 [phone]

## 2022-11-23 IMAGING — CT CT HEAD W/O CM
3 series · 16 of 47 positions shown, 19 images · non-contrast
Comparison: None Available.

CLINICAL DATA: Dizziness, persistent/recurrent, cardiac or vascular
cause suspected



[Series 2: head wo · axial · 0.46mm/px · z∈[-342,-207]mm · 10 of 33 slices shown, 13 images]
[im 3/33  brain]
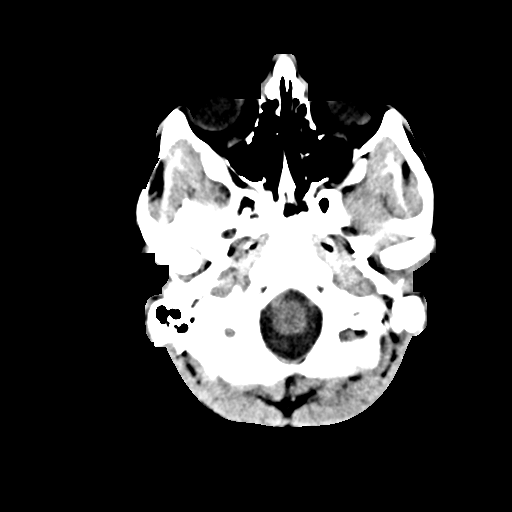
[im 3/33  bone]
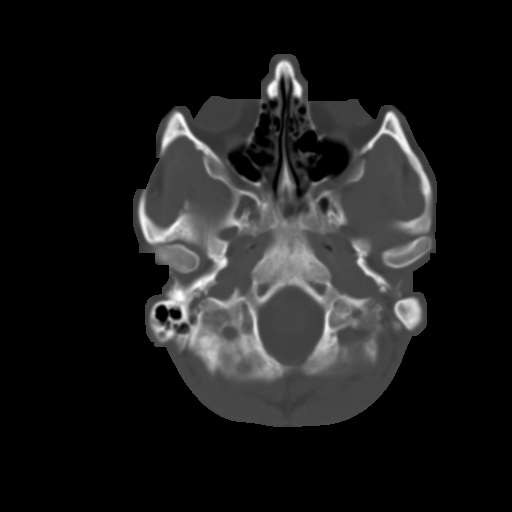
[im 6/33  brain]
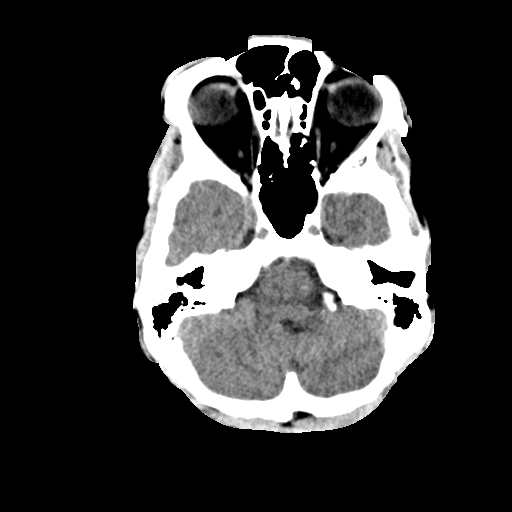
[im 9/33  brain]
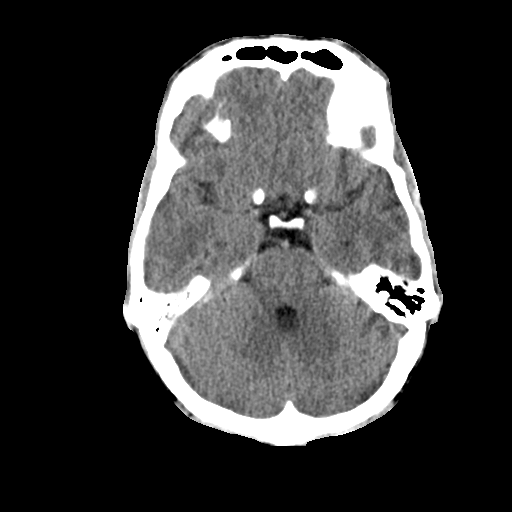
[im 12/33  brain]
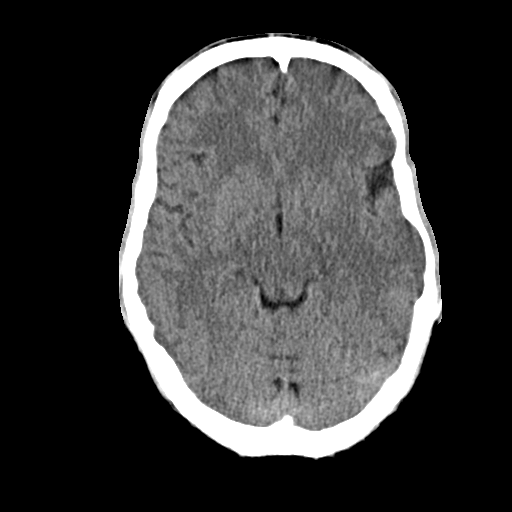
[im 15/33  brain]
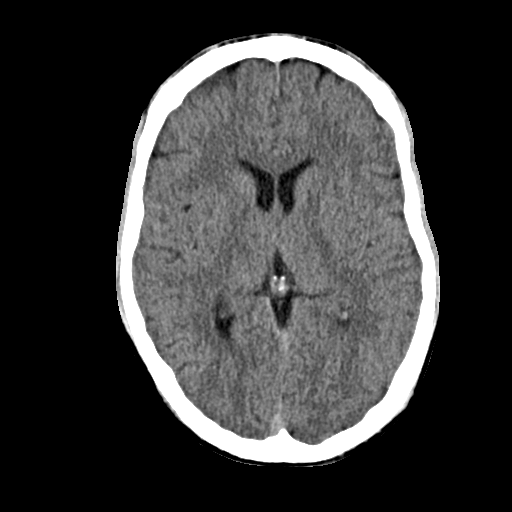
[im 15/33  bone]
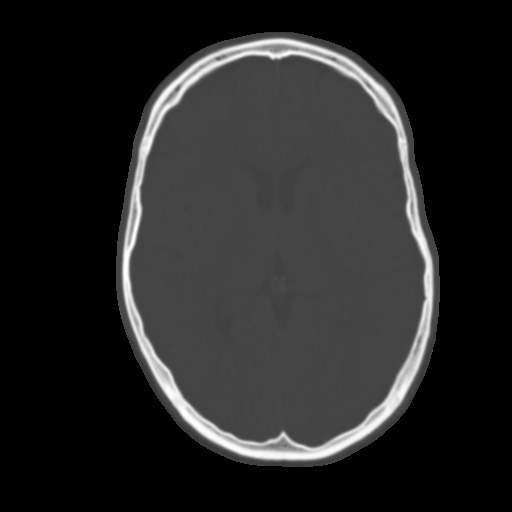
[im 18/33  brain]
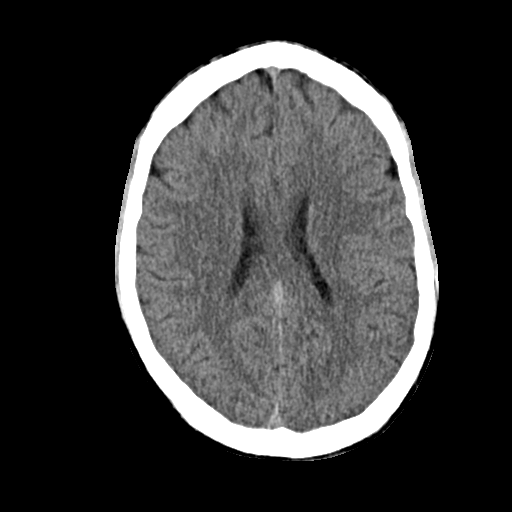
[im 21/33  brain]
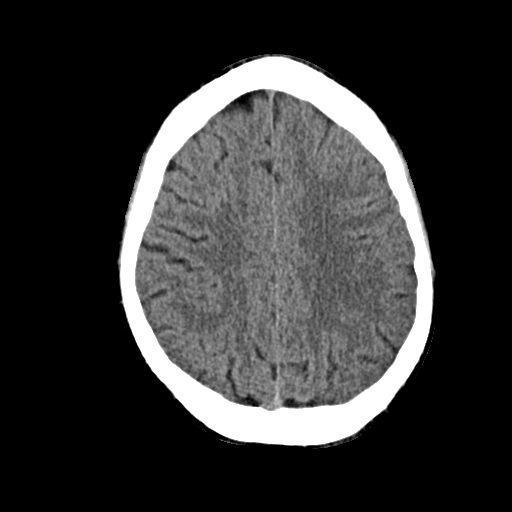
[im 25/33  brain]
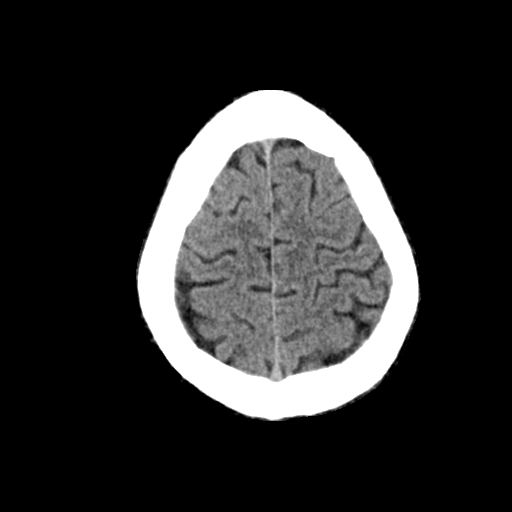
[im 27/33  brain]
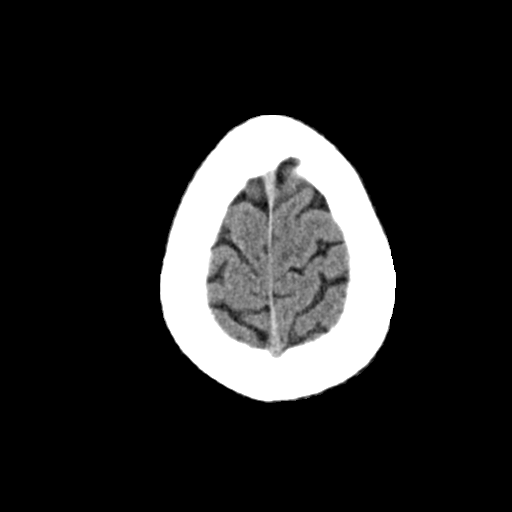
[im 27/33  bone]
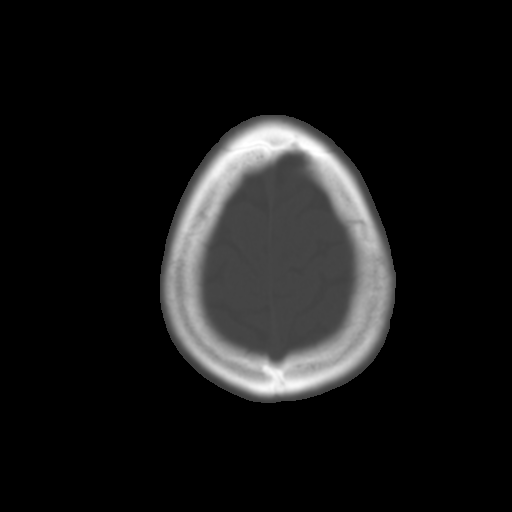
[im 30/33  brain]
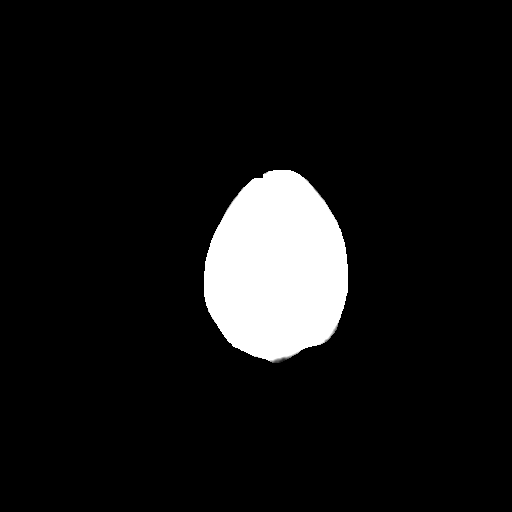

[Series 4: coronal soft · coronal · 0.36mm/px · 3 of 71 slices shown]
[im 24/71  brain]
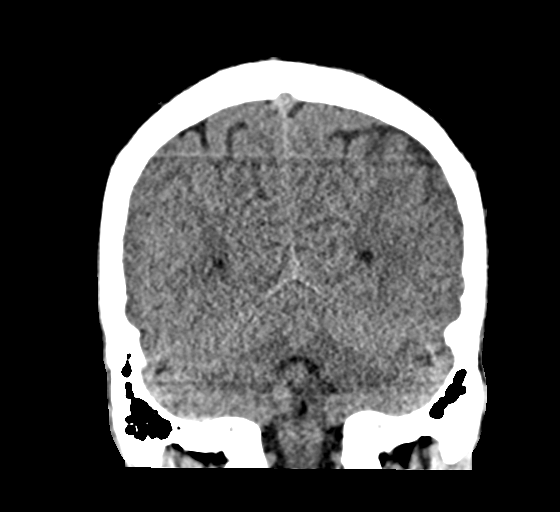
[im 32/71  brain]
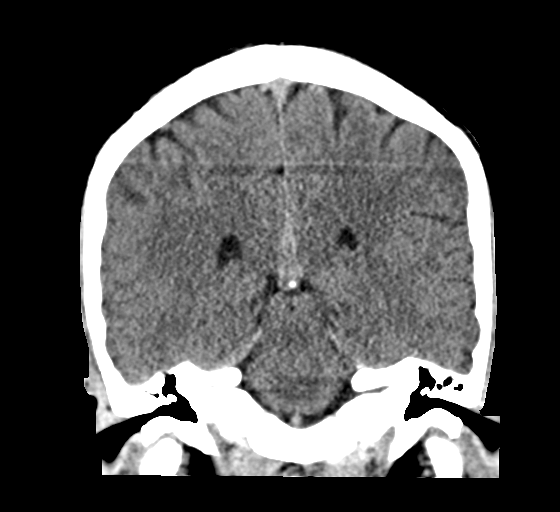
[im 39/71  brain]
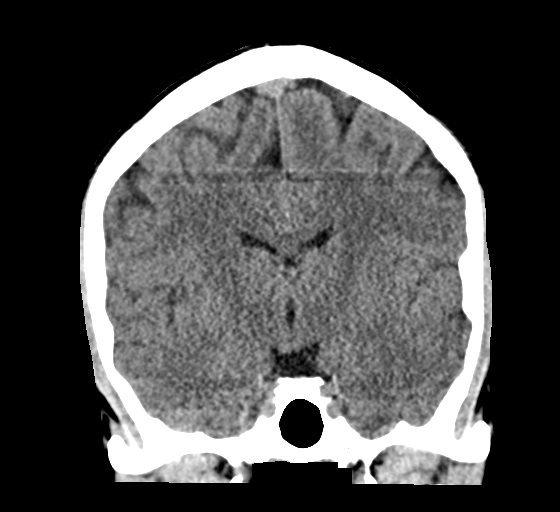

[Series 5: sag soft · sagittal · 0.36mm/px · 3 of 61 slices shown]
[im 21/61  brain]
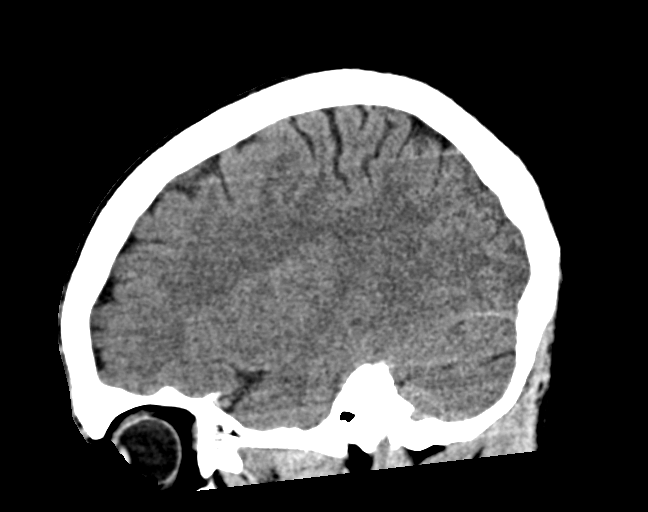
[im 31/61  brain]
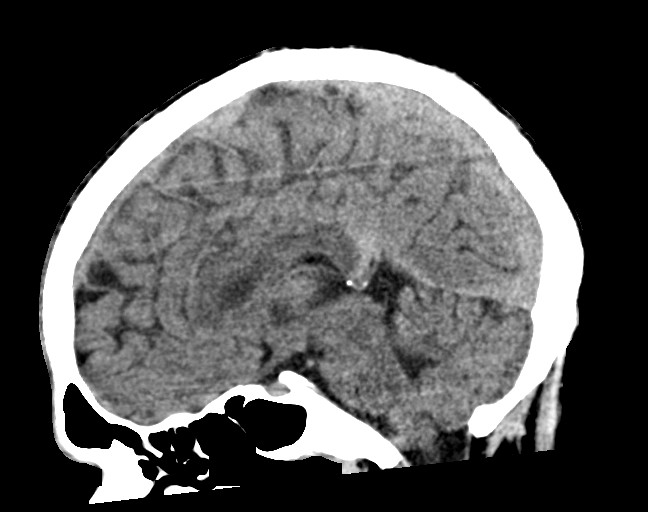
[im 41/61  brain]
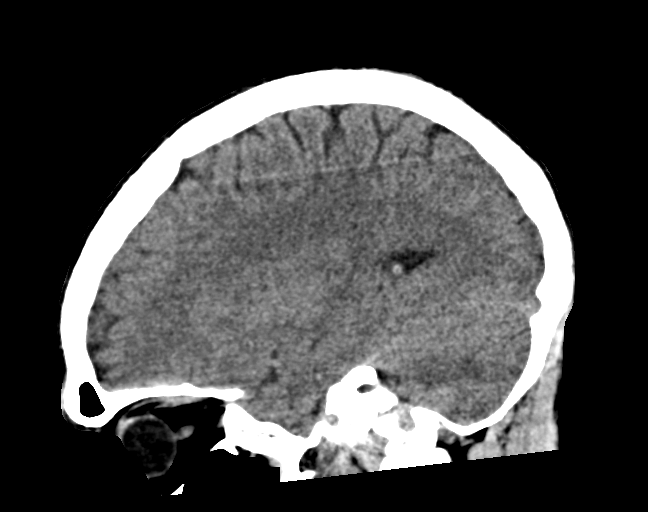

[16 of 47 positions shown; findings below may reference images not displayed]

FINDINGS: Brain: No acute intracranial abnormality. Specifically, no
hemorrhage, hydrocephalus, mass lesion, acute infarction, or
significant intracranial injury.

Vascular: No hyperdense vessel or unexpected calcification.

Skull: No acute calvarial abnormality.

Sinuses/Orbits: No acute findings

Other: None
IMPRESSION: Normal study.

## 2023-03-05 ENCOUNTER — Encounter: Payer: Self-pay | Admitting: Dietician

## 2023-03-05 ENCOUNTER — Encounter: Payer: No Typology Code available for payment source | Attending: Family Medicine | Admitting: Dietician

## 2023-03-05 VITALS — Ht 68.0 in | Wt 166.1 lb

## 2023-03-05 DIAGNOSIS — E162 Hypoglycemia, unspecified: Secondary | ICD-10-CM | POA: Insufficient documentation

## 2023-03-05 NOTE — Progress Notes (Signed)
Medical Nutrition Therapy  Appointment Start time:  11:10  Appointment End time:  12:03  Primary concerns today: Hypoglycemia  Referral diagnosis: Hypoglycemia Preferred learning style: no preference indicated (auditory, visual, hands on, no preference indicated) Learning readiness: ready (not ready, contemplating, ready, change in progress)  NUTRITION ASSESSMENT   Anthropometrics  Weight: 166.1 lbs Height: 68 in    Clinical Medical Hx: gastritis, HTN Medications: multivitamin, ginger, Vit D + K2, CoQ10 Labs: WBC 3.99 Notable Signs/Symptoms: none noted  Lifestyle & Dietary Hx  Pt states he gets up about 10 am and goes to work at 3 pm. Pt works 2nd shift. Pt states he stopped drinking coffee, stating it caused stomach pains. Pt states he does not tolerate caffeine very well. Pt has been recording his blood sugars; fasting blood sugars have been in the 90's. Pt records states he has not had blood sugars below 80, fasting or 2-3 hours after a meal.  Pt states when he has felt symptoms of hypoglycemia, he has checked his blood sugars and they have been in the 80s and 90s. Highs have been in the 120s. Pt did say that he had a reading of 180, 4-5 hours after a meal. Pt states he has been hungry more lately.  Estimated daily fluid intake: 64-128 oz Supplements: multivitamin, ginger, Vit D + K2, CoQ10 Sleep: good Stress / self-care: deal with it/ push through Current average weekly physical activity: ADLs, work  24-Hr Dietary Recall First Meal: 11:00 am or noon chicken sausage, mini avocado, blueberry, sourdough with butter. Snack: Aussie bites, cheese stick, beef stick Second Meal:  5 pm meat, sweet potatoes or rice or quinoa or mashed potatoes  Snack:  Third Meal: 12:45 am meat, sweet potatoes or rice or quinoa or mashed potatoes. Snack:  Beverages: water, low sugar body armour or coconut water  Estimated Energy Needs Calories: 2400  NUTRITION DIAGNOSIS  NI-1.4 Inadequate  energy intake As related to hypoglycemia.  As evidenced by symptoms of hypoglycemia.  NUTRITION INTERVENTION  Nutrition education (E-1) on the following topics:  Why you need complex carbohydrates: Whole grains and other complex carbohydrates are required to have a healthy diet. Whole grains provide fiber which can help with blood glucose levels and help keep you satiated. Fruits and starchy vegetables provide essential vitamins and minerals required for immune function, eyesight support, brain support, bone density, wound healing and many other functions within the body. According to the current evidenced based 2020-2025 Dietary Guidelines for Americans, complex carbohydrates are part of a healthy eating pattern which is associated with a decreased risk for type 2 diabetes, cancers, and cardiovascular disease.  Health benefits of physical activity. Encouraged pt to continue to eat balanced meals inclusive of non starchy vegetables 2 times a day 7 days a week Encouraged pt to choose lean protein sources: limiting beef, pork, sausage, hotdogs, and lunch meat Encourage pt to choose healthy fats such as plant based limiting animal fats Encouraged pt to continue to drink a minium 64 fluid ounces with half being plain water to satisfy proper hydration    Handouts Provided Include  Hypoglycemia (Not Caused by Diabetes) Nutrition Therapy Health Benefits of Physical Activity  Learning Style & Readiness for Change Teaching method utilized: Visual & Auditory  Demonstrated degree of understanding via: Teach Back  Barriers to learning/adherence to lifestyle change: busy work schedule  Goals Established by Pt Increase physical activity; 5 days week at the gym, weights and then cardio, min of 60 minutes.. Eat 5-6 small  meals a day (3 meals a day with snacks). Increase food intake related to increased energy expenditure with increased physical activity and increased hunger. Continue: your great choices for  meals and snacks.  MONITORING & EVALUATION Dietary intake, weekly physical activity.  Next Steps  Patient is to return 2 months for follow-up.

## 2023-05-01 ENCOUNTER — Encounter: Payer: Self-pay | Admitting: Dietician

## 2023-05-01 ENCOUNTER — Encounter: Payer: No Typology Code available for payment source | Attending: Family Medicine | Admitting: Dietician

## 2023-05-01 DIAGNOSIS — E162 Hypoglycemia, unspecified: Secondary | ICD-10-CM | POA: Diagnosis present

## 2023-05-01 NOTE — Progress Notes (Signed)
Medical Nutrition Therapy  Appointment Start time:  11:09  Appointment End time:  11:50  Primary concerns today: Hypoglycemia  Referral diagnosis: Hypoglycemia Preferred learning style: no preference indicated (auditory, visual, hands on, no preference indicated) Learning readiness: ready (not ready, contemplating, ready, change in progress)  NUTRITION ASSESSMENT   Anthropometrics  Weight: 157.8 lbs Height: 68 in    Body Composition Scale 05/01/2023  Current Body Weight 157.8  Total Body Fat % 13.4  Visceral Fat 8  Fat-Free Mass % 86.5   Total Body Water % 67.5  Muscle-Mass lbs 29.6  BMI 24.1  Body Fat Displacement          Torso  lbs 13.1         Left Leg  lbs 2.6         Right Leg  lbs 2.6         Left Arm  lbs 1.3         Right Arm  lbs 1.3   Clinical Medical Hx: gastritis, HTN Medications: multivitamin, ginger, Vit D + K2, CoQ10 Labs: WBC 3.99; basic metabolic panel form 02/07/2022 WNL Notable Signs/Symptoms: none noted  Lifestyle & Dietary Hx  Pt states blood sugars have been better, stating they have not been as low. Pt states he has not been snacking to see if that will help, stating he has been fine without the snacks. 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 Pt still wonders what was causing the light headedness, stating his blood sugars were always in normal range. Pt states it has improved since last visit, stating the last time he it happened was last week. Pt states he salts his food.  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 Snack:  Second Meal:  5 pm meat, sweet potatoes or rice or quinoa or mashed potatoes  Snack:  Third  Meal: 12:45 am meat, sweet potatoes or rice or quinoa or mashed potatoes. Snack:  Beverages: water, low sugar body armour or coconut water  Estimated Energy Needs Calories: 2400  NUTRITION DIAGNOSIS  NI-1.4 Inadequate energy intake As related to hypoglycemia.  As evidenced by symptoms of hypoglycemia.  NUTRITION INTERVENTION  Nutrition education (E-1) on the following topics:  Why you need complex carbohydrates: Whole grains and other complex carbohydrates are required to have a healthy diet. Whole grains provide fiber which can help with blood glucose levels and help keep you satiated. Fruits and starchy vegetables provide essential vitamins and minerals required for immune function, eyesight support, brain support, bone density, wound healing and many other functions within the body. According to the current evidenced based 2020-2025 Dietary Guidelines for Americans, complex carbohydrates are part of a healthy eating pattern which is associated with a decreased risk for type 2 diabetes, cancers, and cardiovascular disease.  Health benefits of physical activity. Encouraged pt to continue to eat balanced meals inclusive of non starchy vegetables 2 times a day 7 days a week Encouraged pt to choose lean protein sources: limiting beef, pork, sausage, hotdogs, and lunch meat Encourage pt to choose healthy fats such as plant based limiting animal fats Encouraged pt to continue to drink a minium 64 fluid ounces with half being plain water to satisfy  proper hydration  Fruits & Vegetables: Aim to fill half your plate with a variety of fruits and vegetables. They are rich in vitamins, minerals, and fiber, and can help reduce the risk of chronic diseases. Choose a colorful assortment of fruits and vegetables to ensure you get a wide range of nutrients. Grains and Starches: Make at least half of your grain choices whole grains, such as Manvir Thorson rice, whole wheat bread, and oats. Whole grains provide fiber,  which aids in digestion and healthy cholesterol levels. Aim for whole forms of starchy vegetables such as potatoes, sweet potatoes, beans, peas, and corn, which are fiber rich and provide many vitamins and minerals.  Protein: Incorporate lean sources of protein, such as poultry, fish, beans, nuts, and seeds, into your meals. Protein is essential for building and repairing tissues, staying full, balancing blood sugar, as well as supporting immune function. Dairy: Include low-fat or fat-free dairy products like milk, yogurt, and cheese in your diet. Dairy foods are excellent sources of calcium and vitamin D, which are crucial for bone health.  Physical Activity: Aim for 60 minutes of physical activity daily. Regular physical activity promotes overall health-including helping to reduce risk for heart disease and diabetes, promoting mental health, and helping [place] sleep better.  Handouts Provided Include  Body Comp Scale printout  Learning Style & Readiness for Change Teaching method utilized: Visual & Auditory  Demonstrated degree of understanding via: Teach Back  Barriers to learning/adherence to lifestyle change: busy work schedule  Goals Established by Pt Continue: Increase physical activity; 5 days week at the gym, weights and then cardio, min of 60 minutes.. Continue: Eat 5-6 small meals a day (3 meals a day with snacks). Continue: Increase food intake related to increased energy expenditure with increased physical activity and increased hunger. Continue: your great choices for meals and snacks. New: Identify what you had to eat prior to episode of light-headedness. New: request for iron and B12 at your annual physical later this month.  MONITORING & EVALUATION Dietary intake, weekly physical activity.  Next Steps  Patient is to return 2 months for follow-up.

## 2023-07-31 ENCOUNTER — Encounter: Payer: Self-pay | Admitting: Dietician

## 2023-07-31 ENCOUNTER — Encounter: Payer: No Typology Code available for payment source | Attending: Family Medicine | Admitting: Dietician

## 2023-07-31 VITALS — Ht 68.0 in | Wt 159.8 lb

## 2023-07-31 DIAGNOSIS — E162 Hypoglycemia, unspecified: Secondary | ICD-10-CM | POA: Insufficient documentation

## 2023-07-31 NOTE — Progress Notes (Signed)
Medical Nutrition Therapy  Appointment Start time:  11:09  Appointment End time:  11:45  Primary concerns today: Hypoglycemia  Referral diagnosis: Hypoglycemia Preferred learning style: no preference indicated (auditory, visual, hands on, no preference indicated) Learning readiness: ready (not ready, contemplating, ready, change in progress)  NUTRITION ASSESSMENT   Anthropometrics  Weight:  lbs Height: 68 in    Body Composition Scale 05/01/2023 07/31/2023  Current Body Weight 157.8 159.8  Total Body Fat % 13.4 13.2  Visceral Fat 8 8  Fat-Free Mass % 86.5 68.7   Total Body Water % 67.5 67.7  Muscle-Mass lbs 29.6 29.9  BMI 24.1 24.2  Body Fat Displacement           Torso  lbs 13.1 13.0         Left Leg  lbs 2.6 2.6         Right Leg  lbs 2.6 2.6         Left Arm  lbs 1.3 1.3         Right Arm  lbs 1.3 1.3   Clinical Medical Hx: gastritis, HTN Medications: multivitamin, ginger, Vit D + K2, CoQ10 Labs: WBC 3.89; basic metabolic panel form 02/07/2022 WNL Notable Signs/Symptoms: none noted  Lifestyle & Dietary Hx  Pt states his WBC have always been at or below baseline, stating he asked his provider and PCP stated there is nothing to be concerned about. Pt states he has not had as many symptoms of low blood sugars, stating he is feeling decent and has not been checking blood sugars. Pt states he sometimes feels lightheaded at the front of his forehead, between his eyebrows, stating once every two weeks or so. Pt states it feels like his face is getting scrunched up for a second. Pt states that most episodes of lightheaded happens at work, stating that it could be related to stress. Pt arrived with labs from the [place]. B12 was WNL. HCT and HGB WNL.  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 Snack:  Second Meal:  5 pm meat, sweet potatoes or rice or quinoa or mashed potatoes  Snack:  Third Meal: 12:45 am meat, sweet potatoes or rice or quinoa or mashed potatoes. Snack:  Beverages: water, low sugar body armour or coconut water  Estimated Energy Needs Calories: 2400  NUTRITION DIAGNOSIS  NI-1.4 Inadequate energy intake As related to hypoglycemia.  As evidenced by symptoms of hypoglycemia.  NUTRITION INTERVENTION  Nutrition education (E-1) on the following topics:  Physical Activity: Aim for 60 minutes of physical activity daily. Regular physical activity promotes overall health-including helping to reduce risk for heart disease and diabetes, promoting mental health, and helping [place] sleep better.  Handouts Provided Include  Body Comp Scale printout  Learning Style & Readiness for Change Teaching method utilized: Visual & Auditory  Demonstrated degree of understanding via: Teach Back  Barriers to learning/adherence to lifestyle change: busy work schedule  Goals Established by Pt Continue: Increase physical activity; 5 days week at the gym, weights and then cardio, min of 60 minutes.. Continue: Eat 5-6 small meals a day (3 meals a day with snacks). Continue: Increase  food intake related to increased energy expenditure with increased physical activity and increased hunger. Continue: your great choices for meals and snacks. Continue: Identify what you had to eat prior to episode of light-headedness. New: focus on self care; identify stress management techniques to lean on when at work (listen to music, deep breathing, etc...)  MONITORING & EVALUATION Dietary intake, weekly physical activity.  Next Steps  Patient is to follow-up as needed.

## 2024-05-25 ENCOUNTER — Encounter: Payer: Self-pay | Admitting: Emergency Medicine

## 2024-05-25 ENCOUNTER — Ambulatory Visit
Admission: EM | Admit: 2024-05-25 | Discharge: 2024-05-25 | Disposition: A | Discharge status: Home / Self Care | Attending: Emergency Medicine | Admitting: Emergency Medicine

## 2024-05-25 DIAGNOSIS — R197 Diarrhea, unspecified: Secondary | ICD-10-CM | POA: Insufficient documentation

## 2024-05-25 MED ORDER — LOPERAMIDE HCL 2 MG PO CAPS: 2.0000 mg | ORAL_CAPSULE | Freq: Four times a day (QID) | ORAL | 0 refills | Status: AC | PRN

## 2024-05-25 MED ORDER — AZITHROMYCIN 500 MG PO TABS: 500.0000 mg | ORAL_TABLET | Freq: Every day | ORAL | 0 refills | Status: AC

## 2024-05-25 NOTE — ED Provider Notes (Signed)
 Matthew Meadows    CSN: [id number] Arrival date & time: 05/25/24  1150      History   Chief Complaint Chief Complaint  Patient presents with   Diarrhea    HPI Matthew Meadows is a 33 y.o. male.   Patient presents for evaluation of watery diarrhea beginning 3 days ago.  Has been experiencing at least 8 episodes per day.  Denies nausea, vomiting, abdominal bloating, increased gas production, fever or URI symptoms.  Had noticed blood when wiping 2 days ago which has resolved, denies presence of blood or mucus within the stool.  Denies dietary changes or recent travel.  No known sick contact with similar symptoms.  Has attempted use of Pepto-Bismol which has been minimally effective.  History reviewed. No pertinent past medical history.  Patient Active Problem List   Diagnosis Date Noted   Myopia 04/13/2021   Dysuria 03/14/2017    History reviewed. No pertinent surgical history.     Home Medications    Prior to Admission medications   Medication Sig Start Date End Date Taking? Authorizing Provider  azithromycin (ZITHROMAX) 500 MG tablet Take 1 tablet (500 mg total) by mouth daily for 3 days. 05/25/24 05/28/24 Yes Rateel Beldin, Shelba SAUNDERS, NP  loperamide (IMODIUM) 2 MG capsule Take 1 capsule (2 mg total) by mouth 4 (four) times daily as needed for diarrhea or loose stools. 05/25/24  Yes Nikalas Bramel R, NP  pantoprazole  (PROTONIX ) 40 MG tablet Take 1 tablet (40 mg total) by mouth daily. 04/13/21   Jinny Carmine, MD    Family History History reviewed. No pertinent family history.  Social History Social History   Tobacco Use   Smoking status: Never   Smokeless tobacco: Never  Substance Use Topics   Alcohol use: Yes   Drug use: Never     Allergies   Patient has no known allergies.   Review of Systems Review of Systems   Physical Exam Triage Vital Signs ED Triage Vitals  Encounter Vitals Group     BP 05/25/24 1210 109/68     Girls Systolic BP Percentile --       Girls Diastolic BP Percentile --      Boys Systolic BP Percentile --      Boys Diastolic BP Percentile --      Pulse Rate 05/25/24 1210 82     Resp 05/25/24 1210 18     Temp 05/25/24 1210 98.3 F (36.8 C)     Temp Source 05/25/24 1210 Oral     SpO2 05/25/24 1210 98 %     Weight --      Height --      Head Circumference --      Peak Flow --      Pain Score 05/25/24 1212 0     Pain Loc --      Pain Education --      Exclude from Growth Chart --    No data found.  Updated Vital Signs BP 109/68 (BP Location: Left Arm)   Pulse 82   Temp 98.3 F (36.8 C) (Oral)   Resp 18   SpO2 98%   Visual Acuity Right Eye Distance:   Left Eye Distance:   Bilateral Distance:    Right Eye Near:   Left Eye Near:    Bilateral Near:     Physical Exam Constitutional:      Appearance: Normal appearance.  Eyes:     Extraocular Movements: Extraocular movements intact.  Pulmonary:  Effort: Pulmonary effort is normal.  Abdominal:     General: Bowel sounds are increased.     Tenderness: There is no abdominal tenderness. There is no right CVA tenderness, left CVA tenderness or guarding.  Neurological:     Mental Status: He is alert and oriented to person, place, and time. Mental status is at baseline.      UC Treatments / Results  Labs (all labs ordered are listed, but only abnormal results are displayed) Labs Reviewed  GASTROINTESTINAL PANEL BY PCR, STOOL (REPLACES STOOL CULTURE)    EKG   Radiology No results found.  Procedures Procedures (including critical care time)  Medications Ordered in UC Medications - No data to display  Initial Impression / Assessment and Plan / UC Course  I have reviewed the triage vital signs and the nursing notes.  Pertinent labs & imaging results that were available during my care of the patient were reviewed by me and considered in my medical decision making (see chart for details).  Diarrhea  Vital signs are stable, patient in no signs  of distress nontoxic-appearing, no abdominal tenderness noted on exam, low suspicion for any acutely inflamed or infected organ, discussed this with patient, GI panel pending, empirically placed on azithromycin and Imodium sent for treatment of diarrhea, recommend increase fluid intake with food as tolerated with follow-up if symptoms continue to persist or worsen.  Final Clinical Impressions(s) / UC Diagnoses   Final diagnoses:  Diarrhea, unspecified type     Discharge Instructions      Evaluated for diarrhea, typically diarrhea can fix itself with time but as it has persisted for 3 days without any signs of resolution we will place you on antibiotics  Take azithromycin every morning for 3 days  For the treatment of diarrhea you may use Imodium every 6 hours as needed  Stool sample has been sent to the lab, pending for approximately 3 days, checking for common parasites and bacteria that can cause diarrhea and you will be notified of positive test results only  Increase your fluid intake until symptoms resolved to maintain your hydration, may continue diet as tolerable  If your symptoms continue to persist despite medicine please follow-up with urgent care or your primary doctor for reevaluation   ED Prescriptions     Medication Sig Dispense Auth. Provider   azithromycin (ZITHROMAX) 500 MG tablet Take 1 tablet (500 mg total) by mouth daily for 3 days. 3 tablet Jalie Eiland R, NP   loperamide (IMODIUM) 2 MG capsule Take 1 capsule (2 mg total) by mouth 4 (four) times daily as needed for diarrhea or loose stools. 12 capsule Nalia Honeycutt R, NP      PDMP not reviewed this encounter.   Teresa Shelba SAUNDERS, NP 05/25/24 1243

## 2024-05-25 NOTE — Discharge Instructions (Signed)
 Evaluated for diarrhea, typically diarrhea can fix itself with time but as it has persisted for 3 days without any signs of resolution we will place you on antibiotics  Take azithromycin every morning for 3 days  For the treatment of diarrhea you may use Imodium every 6 hours as needed  Stool sample has been sent to the lab, pending for approximately 3 days, checking for common parasites and bacteria that can cause diarrhea and you will be notified of positive test results only  Increase your fluid intake until symptoms resolved to maintain your hydration, may continue diet as tolerable  If your symptoms continue to persist despite medicine please follow-up with urgent care or your primary doctor for reevaluation

## 2024-05-25 NOTE — ED Triage Notes (Signed)
 Patient reports diarrhea x 3 days. Patient denies nausea and vomiting. Denies pain at this time.

## 2024-05-26 ENCOUNTER — Ambulatory Visit (HOSPITAL_COMMUNITY): Payer: Self-pay

## 2024-05-26 LAB — GASTROINTESTINAL PANEL BY PCR, STOOL (REPLACES STOOL CULTURE)
